# Patient Record
Sex: Male | Born: 1970 | Race: White | Hispanic: No | Marital: Single | Smoking: Current every day smoker
Health system: Southern US, Community
[De-identification: ages and names within clinical notes are randomized; demographics above are authoritative.]

## PROBLEM LIST (undated history)

## (undated) HISTORY — PX: TONSILLECTOMY: SUR1361

---

## 2012-02-25 ENCOUNTER — Emergency Department
Admission: EM | Admit: 2012-02-25 | Discharge: 2012-02-25 | Disposition: A | Discharge status: Home / Self Care | Payer: BC Managed Care – PPO | Source: Home / Self Care

## 2012-02-25 ENCOUNTER — Encounter: Payer: Self-pay | Admitting: *Deleted

## 2012-02-25 DIAGNOSIS — M545 Low back pain, unspecified: Secondary | ICD-10-CM

## 2012-02-25 DIAGNOSIS — M549 Dorsalgia, unspecified: Secondary | ICD-10-CM

## 2012-02-25 MED ORDER — METAXALONE 800 MG PO TABS: 800.0000 mg | ORAL_TABLET | Freq: Three times a day (TID) | ORAL | Status: AC

## 2012-02-25 MED ORDER — TRAMADOL-ACETAMINOPHEN 37.5-325 MG PO TABS: 1.0000 | ORAL_TABLET | Freq: Four times a day (QID) | ORAL | Status: AC | PRN

## 2012-02-25 NOTE — ED Provider Notes (Signed)
History     CSN: [id number]  Arrival date & time 02/25/12  1126   None     Chief Complaint  Patient presents with  . Back Pain    upper back    (Consider location/radiation/quality/duration/timing/severity/associated sxs/prior treatment) HPI The patient presents today with back pain.  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  No trauma, no car accidents, no falls. Location: upper mid-right back, no radiation Timing: x 3 week Description: tight, sharp, 5/10 Worse with: rest after work Better with: movement Trauma: no Bladder/bowel incontinence: no Weakness: no Fever/chills: no Night pain: no Unexplained weight loss: no Cancer/immunosuppression: no PMH of osteoporosis or chronic steroid use:  no   History reviewed. No pertinent past medical history.  Past Surgical History  Procedure Date  . Tonsillectomy     Family History  Problem Relation Age of Onset  . Cancer Mother     uterine  . Hypertension Father     History  Substance Use Topics  . Smoking status: Current Everyday Smoker -- 1.0 packs/day for 20 years    Types: Cigarettes  . Smokeless tobacco: Not on file  . Alcohol Use: No      Review of Systems  All other systems reviewed and are negative.    Allergies  Review of patient's allergies indicates no known allergies.  Home Medications   Current Outpatient Rx  Name Route Sig Dispense Refill  . ESOMEPRAZOLE MAGNESIUM 20 MG PO CPDR Oral Take 20 mg by mouth daily before breakfast.    . METAXALONE 800 MG PO TABS Oral Take 1 tablet (800 mg total) by mouth 3 (three) times daily. 21 tablet 0  . METAXALONE 800 MG PO TABS Oral Take 1 tablet (800 mg total) by mouth 3 (three) times daily. 21 tablet 0  . TRAMADOL-ACETAMINOPHEN 37.5-325 MG PO TABS Oral Take 1 tablet by mouth every 6 (six) hours as needed for pain. 30 tablet 0    BP 123/80  Pulse 77  Resp 14  Ht 5\' 10"  (1.778 m)  Wt 204 lb (92.534 kg)  BMI 29.27 kg/m2  SpO2  98%  Physical Exam  Nursing note and vitals reviewed. Constitutional: He is oriented to person, place, and time. He appears well-developed and well-nourished.  HENT:  Head: Normocephalic and atraumatic.  Eyes: No scleral icterus.  Neck: Neck supple.  Cardiovascular: Regular rhythm and normal heart sounds.   Pulmonary/Chest: Effort normal and breath sounds normal. No respiratory distress.  Musculoskeletal:       Cervical back: He exhibits tenderness, pain and spasm. He exhibits normal range of motion, no swelling and no edema.       Back:       Just to the right of midline around C7 or T1 is a spot of tenderness, mild spasm which goes to the inner scapular border.  4 range of motion of the neck with a negative Spurling test.  Distal neurovascular status is intact.   Neurological: He is alert and oriented to person, place, and time.  Skin: Skin is warm and dry.  Psychiatric: He has a normal mood and affect. His speech is normal.    ED Course  Procedures (including critical care time)  Labs Reviewed - No data to display No results found.   1. Pain, upper back       MDM   This seems to be muscle skeletal back pain which has been further exacerbated by his [employer] which includes a lot of heavy lifting pushing  and pulling.  We did not do an x-ray today, however getting 1 in another week if still painful may be appropriate to rule out things such as a clay shoveler fracture or disc pathology.  I gave him a prescription for Skelaxin and Ultracet that he can use for the next week.  He is on Nexium and I told him to be sure that he stays compliant with that while on the Ultracet.  He is also to use a heating pad every day.  He should be cautious work or when he does any kind of lifting pushing pulling or throwing.  Marlaine Hind, MD 02/25/12 [phone]

## 2012-02-25 NOTE — ED Notes (Signed)
Patient c/o upper back pain x 3 weeks. 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 He has used Advil without much relief. The pain is constant.

## 2012-03-14 ENCOUNTER — Emergency Department
Admit: 2012-03-14 | Discharge: 2012-03-14 | Disposition: A | Discharge status: Home / Self Care | Payer: BC Managed Care – PPO | Attending: Family Medicine | Admitting: Family Medicine

## 2012-03-14 ENCOUNTER — Emergency Department
Admission: EM | Admit: 2012-03-14 | Discharge: 2012-03-14 | Disposition: A | Discharge status: Home / Self Care | Payer: BC Managed Care – PPO | Source: Home / Self Care | Attending: Family Medicine | Admitting: Family Medicine

## 2012-03-14 ENCOUNTER — Encounter: Payer: Self-pay | Admitting: *Deleted

## 2012-03-14 DIAGNOSIS — M542 Cervicalgia: Secondary | ICD-10-CM

## 2012-03-14 DIAGNOSIS — M5030 Other cervical disc degeneration, unspecified cervical region: Secondary | ICD-10-CM

## 2012-03-14 DIAGNOSIS — M47812 Spondylosis without myelopathy or radiculopathy, cervical region: Secondary | ICD-10-CM

## 2012-03-14 DIAGNOSIS — M546 Pain in thoracic spine: Secondary | ICD-10-CM

## 2012-03-14 MED ORDER — MELOXICAM 15 MG PO TABS: 15.0000 mg | ORAL_TABLET | Freq: Every day | ORAL | Status: AC

## 2012-03-14 NOTE — ED Notes (Addendum)
Pt c/o upper back pain x 2 mths, no injury. He was seen 02/25/2012 was given muscle relaxant which caused GI upset.

## 2012-03-14 NOTE — Discharge Instructions (Signed)
Anterior Cervical Discectomy and Fusion Anterior cervical discectomy is surgery done on the upper spine to relieve pressure on one or more nerve roots, or on the spinal cord. There are 7 bones in your neck, called the cervical spine. These 7 bones (vertebrae) sit one on top of the other. Cushions (intervertebral discs) separate the vertebrae and act like shock absorbers. As we age, degeneration of our bones, joints, and disks can cause neck pain and tightening around the spinal cord and nerve roots. This causes arm pain and weakness.  Degeneration involves:  Herniated Disk. With age, the disks dry up and can rupture. In this condition, the center of the disc bulges out (disk herniation). This can cause pressure on a nerve, which produces pain or weakness in the arm.   Bone spurs and spinal stenosis. As we age, growths often develop on our bones. These growths are called bone spurs (osteophytes). A bone spur is a collection of calcium. As bone spurs grow and extend, the vertebral openings become narrow. The spinal canal and/or the foramen (opening for nerve passageways) become smaller. This narrowing (stenosis) may cause pinching (compression) of the spinal cord or the spinal nerve root. The nerve injury can cause pain, weakness, numbness, and loss of coordination in the upper limbs. Often, patients have difficulty with their hand writing or they start dropping things, because their hand grip is weaker. The spinal cord damage can cause increased stiffness, more frequent falls, electric shooting pain, and changes in bowel and bladder control.  Degeneration in the neck results in three common problems:  Radiculopathy - Nerve compression that results in weakness or pain that radiates down the arm.   Myelopathy - Spinal cord compression that causes stiffness, difficulty with walking, coordination, and trouble with bowel or bladder habits.   Neck pain - Worn out joints cause pain as the neck moves.    Treatment:  Radiculopathy - Surgery is performed to remove the bony and disk material that is pushing on the nerve.   Myelopathy - Surgery is performed to remove the bony and disk material that pushes on the spinal cord.   Neck pain - Surgery is performed to combine (fuse) the joints of the neck together, so they cannot move or cause pain.  Surgery can be done from the front or the back of the neck. When it is done from the front, it is called an anterior (front) cervical (neck) discectomy (removal of the disk) and fusion. LET YOUR CAREGIVER KNOW ABOUT:   Recent infections.   Any shooting pains down your leg, when you move your neck.   Any difficulty swallowing.   If you smoke.   If you are taking any blood thinners or anti-inflammatory drugs.   Any history of injury to your shoulders.   Any history of injury to your vocal cords.   Any foreign objects in your body from a previous surgery.   Any recent fevers or illness.   Past medical history (diabetes, strokes).   Past problems with anesthetics.   Possibility of pregnancy.   History of blood clots (deep vein thrombosis).   History of bleeding or blood problems.   Past surgery.   Other health problems.   Allergies.   Medicines you take, including herbs, eye drops, over-the-counter medicines, and creams.   Use of steroids (by mouth or creams).  RISKS AND COMPLICATIONS  Infection.   Bleeding.   Injury to the following structures:   Carotid artery. This can result in a stroke   or significant amount of bleeding.   Esophageus, resulting in difficulty swallowing.   Recurrent laryngeal nerve, resulting in hoarseness of the voice.   Spinal cord injury, ranging from mild to complete quadriparesis (muscle weakness in all four limbs).   Nerve root injury, resulting in muscle weakness in the upper limb.   Leakage of cerebrospinal fluid.  BEFORE THE PROCEDURE   You will be given medicine to help you sleep  (general anesthetic), and a breathing tube will be placed.   You will be given medicine to kill germs (antibiotics), to keep the infection rate down.   The incision site on your neck will be marked.   Your neck will be cleaned, to reduce the risk of infection.  PROCEDURE  An anterior cervical fusion means that the operation is done through the front (anterior) part of your neck. The cut made by the surgeon (incision) is usually within a skin fold line on the neck. After pushing aside the neck muscles, the surgeon removes the affected, degenerated disk and bone spurs (osteophytes), which takes the pressure off the nerves and spinal cord. This is called a decompression. The area where the disc was removed is then filled with a small piece of plastic. This plastic takes the place of the disk and keeps the nerve passageway (foramen) open and clear for the nerves. In most cases, the surgeon uses metal plates or pins (hardware) in the neck, to help stabilize the level being fused. The hardware reduces motion at that level, so it can fuse. This provides extra support to the neck. A cervical fusion procedure takes anywhere from a couple to several hours, depending on the size of the neck, history of previous surgery, and number of levels being fused. AFTER THE PROCEDURE   You will likely spend 24-48 hours in the hospital. During this time, your caregivers will look for any signs of complications from the procedure.   Your caregiver will watch you, to make sure that fluid draining from the surgery slows down. It is important that a large mass of blood does not form in your neck, which would cause difficulty with breathing.   You will get 24 hours of germ killing medicine (antibiotics).   You can start to eat as soon as you feel comfortable.   Once you have started eating, walking, urinating (voiding) and having bowel movements on your own, your caregiver will discharge you home.  HOME CARE INSTRUCTIONS     For 2 weeks, do not soak the incision site under water. Do not swim or take baths. Showers are ok, but rinse off the incision sites.   Do not over exert yourself. Allow time for the incision to heal.   It can take from 6 weeks to 6 months for fusion to take effect. Your caregiver may ask you to wear a neck collar during this time, as they check the fusion with multiple (serial) x-rays.  Document Released: 09/01/2009 Document Revised: 09/02/2011 Document Reviewed: 09/01/2009 [employer] Patient Information 2012 [employer]. 

## 2012-03-14 NOTE — ED Provider Notes (Signed)
History     CSN: [id number]  Arrival date & time 03/14/12  1508   First MD Initiated Contact with Patient 03/14/12 1524      Chief Complaint  Patient presents with  . Back Pain     HPI Comments: Patient returns for follow-up of upper back and neck pain that has been present for about two months (see previous office note).  He reports that there has been no improvement with medication since his previous visit (Skelaxin caused nausea).  He has pain with cough and sneezing.  He has pain when moving in bed at night.  He reports the occurrence of brief tingling sensations in his hands several times daily that resolve with repositioning.  He denies cough.  He recalls no upper back or neck injury.  He is a smoker.  Patient is a 41 y.o. male presenting with back pain. The history is provided by the patient.  Back Pain  This is a chronic problem. Episode onset: about two months ago. The problem occurs constantly. The problem has not changed since onset.The pain is associated with no known injury. The pain is present in the thoracic spine. The quality of the pain is described as aching. The pain does not radiate. The pain is at a severity of 5/10. The pain is moderate. The symptoms are aggravated by certain positions. The pain is worse during the night. Associated symptoms include numbness, paresthesias and tingling. Pertinent negatives include no chest pain, no fever, no weight loss, no headaches, no abdominal pain, no abdominal swelling and no weakness. He has tried muscle relaxants for the symptoms. The treatment provided no relief.    History reviewed. No pertinent past medical history.  Past Surgical History  Procedure Date  . Tonsillectomy     Family History  Problem Relation Age of Onset  . Cancer Mother     uterine  . Hypertension Father     History  Substance Use Topics  . Smoking status: Current Everyday Smoker -- 1.0 packs/day for 20 years    Types: Cigarettes  . Smokeless  tobacco: Not on file  . Alcohol Use: No      Review of Systems  Constitutional: Negative for fever and weight loss.  Cardiovascular: Negative for chest pain.  Gastrointestinal: Negative for abdominal pain.  Musculoskeletal: Positive for back pain.  Neurological: Positive for tingling, numbness and paresthesias. Negative for weakness and headaches.  All other systems reviewed and are negative.    Allergies  Review of patient's allergies indicates no known allergies.  Home Medications   Current Outpatient Rx  Name Route Sig Dispense Refill  . ESOMEPRAZOLE MAGNESIUM 20 MG PO CPDR Oral Take 20 mg by mouth daily before breakfast.    . MELOXICAM 15 MG PO TABS Oral Take 1 tablet (15 mg total) by mouth daily. Take with food each evening 30 tablet 1    BP 136/84  Pulse 78  Temp 98 F (36.7 C) (Oral)  Resp 18  Ht 5\' 9"  (1.753 m)  Wt 201 lb 8 oz (91.4 kg)  BMI 29.76 kg/m2  SpO2 98%  Physical Exam Nursing notes and Vital Signs reviewed. Appearance:  Patient appears healthy, stated age, and in no acute distress Eyes:  Pupils are equal, round, and reactive to light and accomodation.  Extraocular movement is intact.  Conjunctivae are not inflamed  Pharynx:  Normal Neck:  Supple.  No adenopathy.  Neck has full range of motion.  There is distinct mid-line tenderness over the  C-spine from occipital area to upper thoracic spine. Lungs:  Clear to auscultation.  Breath sounds are equal.  Heart:  Regular rate and rhythm without murmurs, rubs, or gallops.  Abdomen:  Nontender without masses or hepatosplenomegaly.  Bowel sounds are present.  No CVA or flank tenderness.  Extremities:  No edema.  No calf tenderness Skin:  No rash present.   Back:  Full range of motion.  There is distinct tenderness over the thoracic spine in the midline from base of C-spine to mid-scapulae.  No rhomboid muscle tenderness.  No lower back tenderness.  No swelling, erythema, or warmth. ED Course  Procedures   none   Dg Chest 2 View  03/14/2012  *RADIOLOGY REPORT*  Clinical Data: Back pain  CHEST - 2 VIEW  Comparison: None.  Findings: The heart is normal in size.  Bronchitic changes. Peripheral lungs are clear.  No pneumothorax or pleural effusion.  IMPRESSION: No active cardiopulmonary disease.  Original Report Authenticated By: Donavan Burnet, M.D.   Dg Cervical Spine 2-3 Views  03/14/2012  *RADIOLOGY REPORT*  Clinical Data: Neck pain  CERVICAL SPINE - 2-3 VIEW  Comparison: None.  Findings: The cervical vertebrae are straightened in alignment. There is degenerative disc disease primarily at C5-6 and to a lesser degree at C4-5 with some loss of disc space and spurring. No prevertebral soft tissue swelling is seen.  The odontoid process is not optimally seen but grossly is intact, and the lung apices are clear.  IMPRESSION: Straightened alignment with degenerative disc disease at C5-6 and to a lesser degree at C4-5.  Original Report Authenticated By: Juline Patch, M.D.   Dg Thoracic Spine 2 View  03/14/2012  *RADIOLOGY REPORT*  Clinical Data: Mid back pain  THORACIC SPINE - 2 VIEW  Comparison: None.  Findings: The thoracic vertebrae are in normal alignment.  No compression deformity is seen.  Intervertebral disc spaces appear normal.  No paravertebral soft tissue swelling is seen.  IMPRESSION: Negative.  Original Report Authenticated By: Juline Patch, M.D.     1. DJD (degenerative joint disease) of cervical spine       MDM  Begin Mobic at bedtime. Followup with neurosurgeon if not improved 1 to 2 months or if develops increasing pain/paresthesias in arms.        Lattie Haw, MD 03/14/12 1705

## 2012-12-11 IMAGING — CR DG CERVICAL SPINE 2 OR 3 VIEWS
3 series · 3 of 3 positions shown · non-contrast
Comparison: None.

CLINICAL DATA: Neck pain

CERVICAL SPINE - 2-3 VIEW

[view not recorded (1 of 3)]
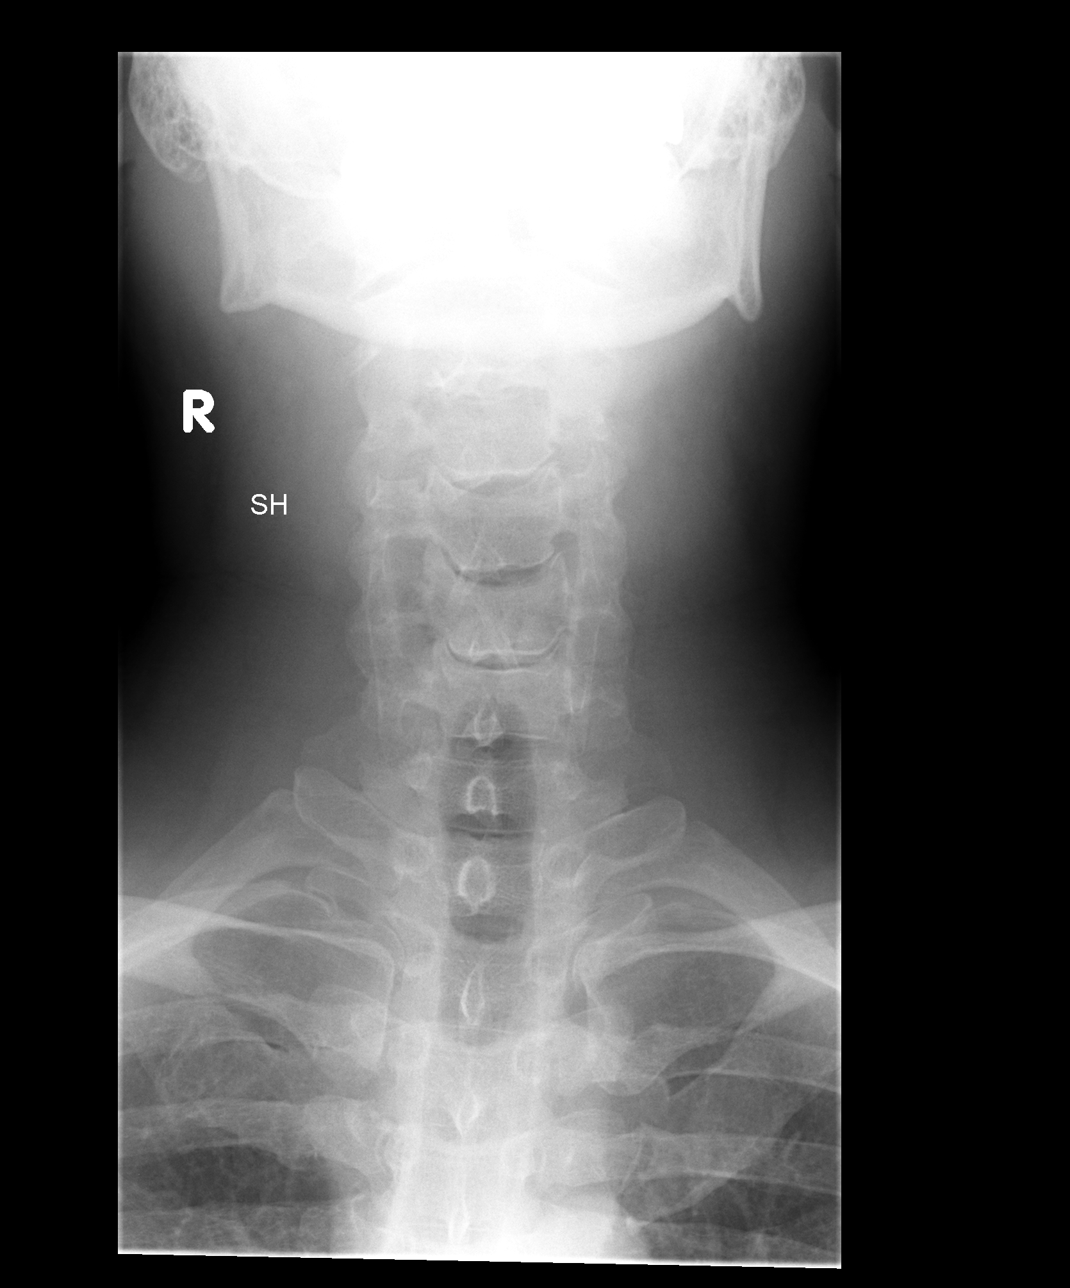

[view not recorded (2 of 3)]
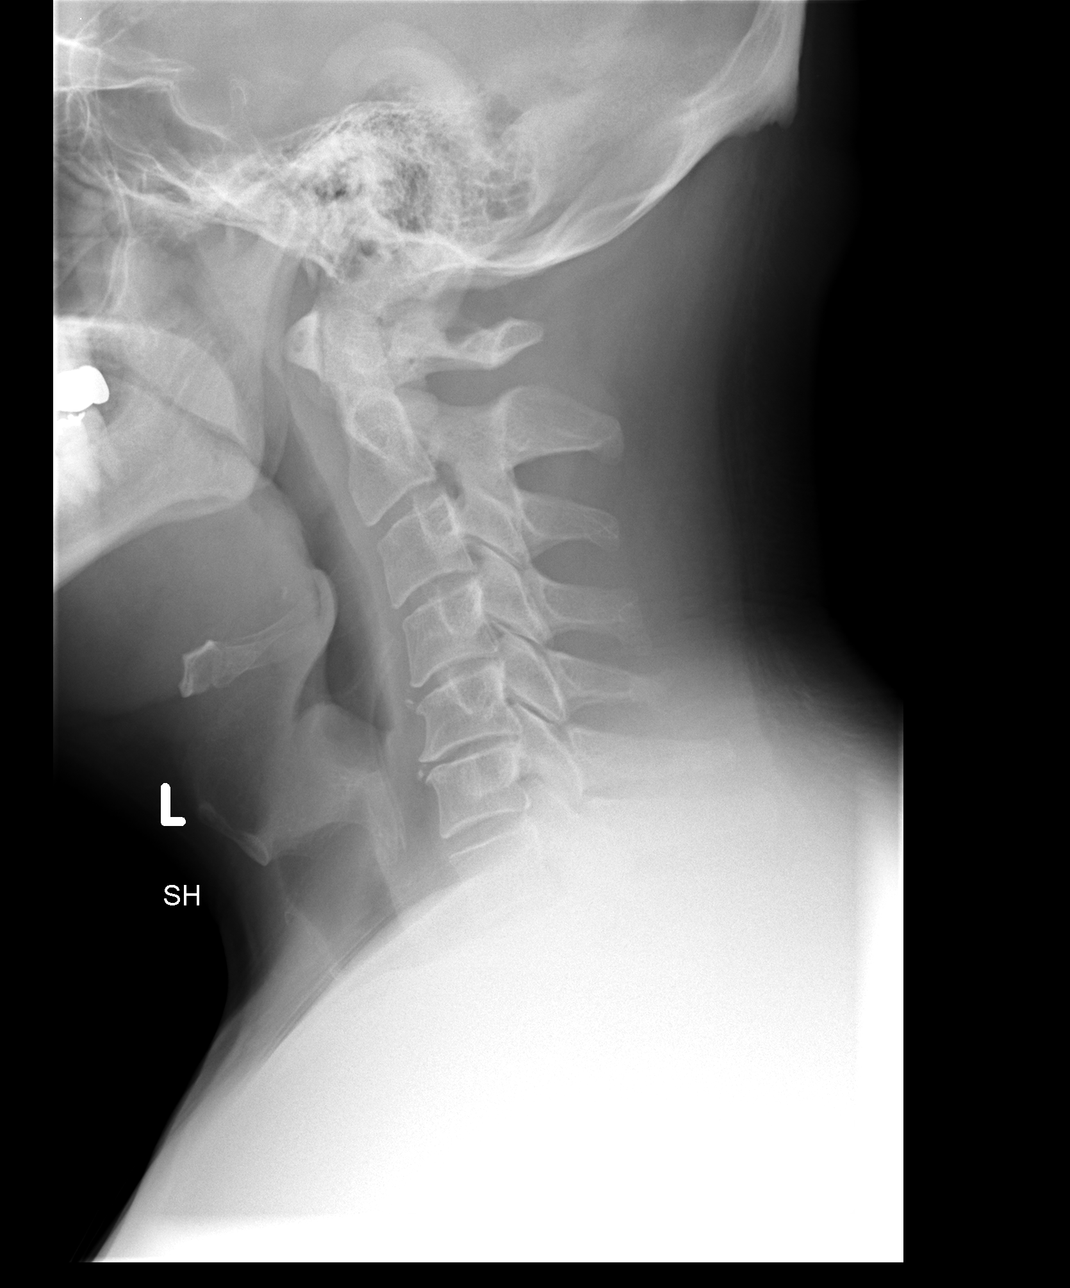

[view not recorded (3 of 3)]
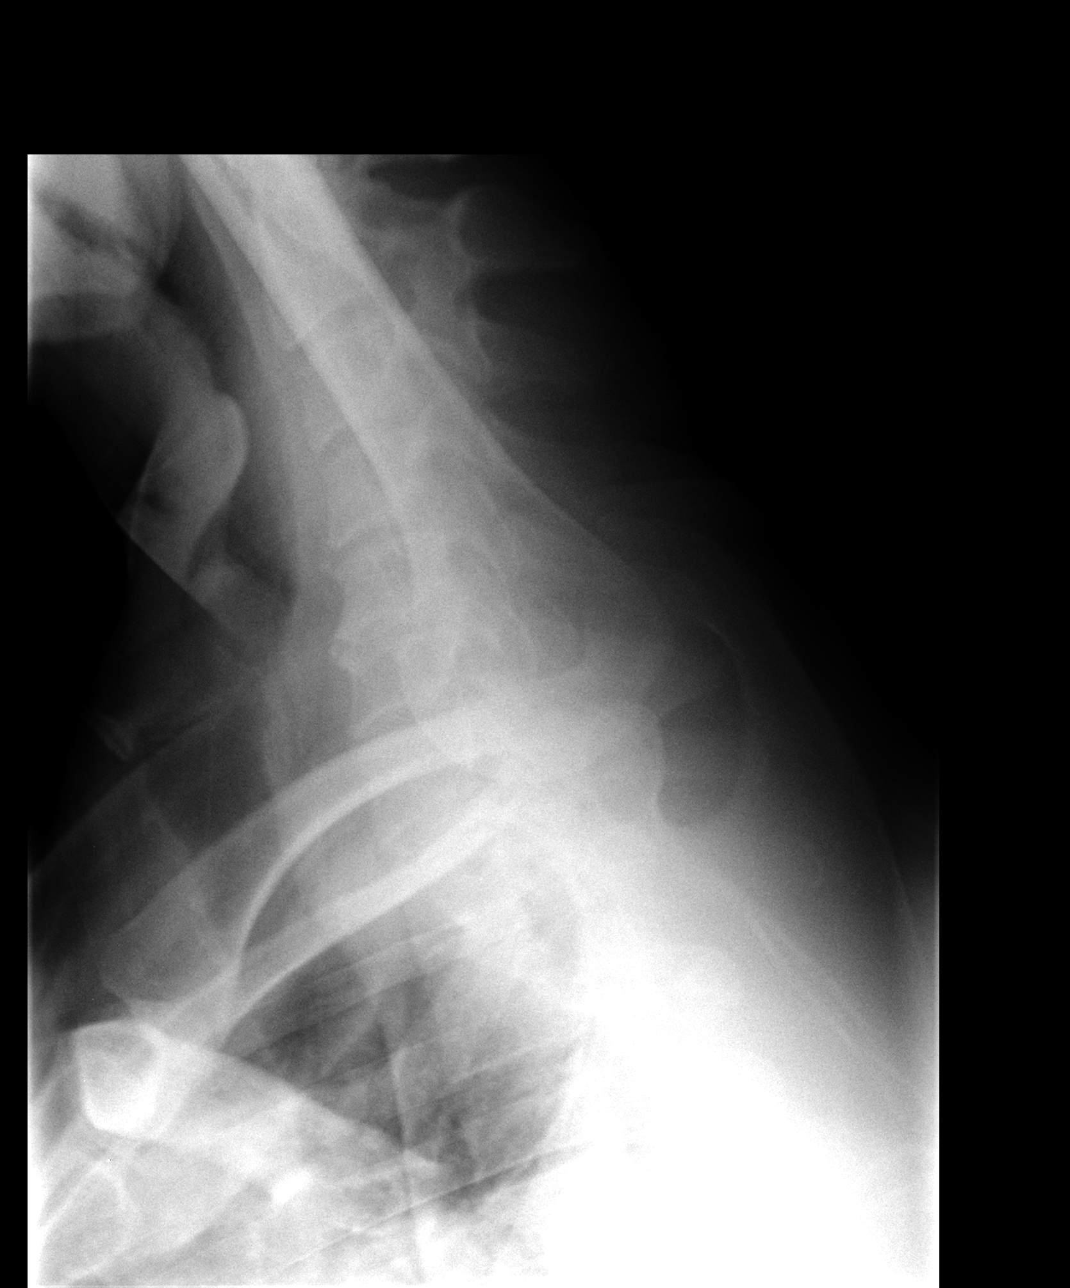

[3 of 3 positions shown; findings below may reference images not displayed]

FINDINGS: The cervical vertebrae are straightened in alignment.
There is degenerative disc disease primarily at C5-6 and to a
lesser degree at C4-5 with some loss of disc space and spurring.
No prevertebral soft tissue swelling is seen.  The odontoid process
is not optimally seen but grossly is intact, and the lung apices
are clear.
IMPRESSION: Straightened alignment with degenerative disc disease at C5-6 and
to a lesser degree at C4-5.

## 2015-04-22 ENCOUNTER — Emergency Department (INDEPENDENT_AMBULATORY_CARE_PROVIDER_SITE_OTHER): Payer: BLUE CROSS/BLUE SHIELD

## 2015-04-22 ENCOUNTER — Emergency Department
Admission: EM | Admit: 2015-04-22 | Discharge: 2015-04-22 | Disposition: A | Discharge status: Home / Self Care | Payer: BLUE CROSS/BLUE SHIELD | Source: Home / Self Care | Attending: Family Medicine | Admitting: Family Medicine

## 2015-04-22 ENCOUNTER — Encounter: Payer: Self-pay | Admitting: *Deleted

## 2015-04-22 DIAGNOSIS — M5030 Other cervical disc degeneration, unspecified cervical region: Secondary | ICD-10-CM | POA: Diagnosis not present

## 2015-04-22 DIAGNOSIS — M542 Cervicalgia: Secondary | ICD-10-CM

## 2015-04-22 MED ORDER — METHOCARBAMOL 500 MG PO TABS: 500.0000 mg | ORAL_TABLET | Freq: Two times a day (BID) | ORAL | Status: DC

## 2015-04-22 MED ORDER — MELOXICAM 7.5 MG PO TABS: 7.5000 mg | ORAL_TABLET | Freq: Every day | ORAL | Status: DC

## 2015-04-22 NOTE — ED Provider Notes (Signed)
CSN: [id number]     Arrival date & time 04/22/15  1328 History   First MD Initiated Contact with Patient 04/22/15 1340     Chief Complaint  Patient presents with  . Neck Pain   (Consider location/radiation/quality/duration/timing/severity/associated sxs/prior Treatment) HPI The patient is a 44 year old male presenting to urgent care with complaints of gradually worsening left-sided neck pain for the last 2-3 months.  Patient states he has had similar pain for the last 2-3 years, but has significantly worsened and states it interferes with his day-to-day activities.  Pain is achy and sore, occasionally will "catch" and be a sharp stabbing pain in the left side of his neck.  Pain is worse with head rotation to the left.  He has tried ibuprofen with minimal relief.  Denies any recent falls or heavy lifting.  States he was seen for same 2-3 years ago at this same urgent care.  Per medical records from 2013.  Patient was diagnosed with degenerative disc disease in the cervical spine.  Patient was advised at that time and follow-up with a neurosurgeon.  However, patient states he has not followed up with any specialist for the symptoms.  Denies numbness, tingling or weakness in arms or legs.  Denies fevers, chills, nausea, vomiting.  Denies rashes or bruising.  History reviewed. No pertinent past medical history. Past Surgical History  Procedure Laterality Date  . Tonsillectomy     Family History  Problem Relation Age of Onset  . Cancer Mother     uterine  . Hypertension Father    History  Substance Use Topics  . Smoking status: Current Every Day Smoker -- 1.00 packs/day for 20 years    Types: Cigarettes  . Smokeless tobacco: Never Used  . Alcohol Use: No    Review of Systems  Constitutional: Negative for fever and chills.  Musculoskeletal: Positive for myalgias, arthralgias, neck pain and neck stiffness. Negative for joint swelling and gait problem.       Left side neck pain  Skin:  Negative for color change, rash and wound.  Neurological: Negative for weakness, numbness and headaches.    Allergies  Review of patient's allergies indicates no known allergies.  Home Medications   Prior to Admission medications   Medication Sig Start Date End Date Taking? Authorizing Provider  meloxicam (MOBIC) 7.5 MG tablet Take 1 tablet (7.5 mg total) by mouth daily. 04/22/15   Junius Finner, PA-C  methocarbamol (ROBAXIN) 500 MG tablet Take 1 tablet (500 mg total) by mouth 2 (two) times daily. 04/22/15   Junius Finner, PA-C   BP 129/82 mmHg  Pulse 88  Resp 16  Wt 194 lb (87.998 kg)  SpO2 96% Physical Exam  Constitutional: He is oriented to person, place, and time. He appears well-developed and well-nourished.  HENT:  Head: Normocephalic and atraumatic.  Eyes: EOM are normal.  Neck: Normal range of motion. Neck supple.  Tenderness to cervical spine and Left cervical muscles. Increased pain with head rotation to the Left.  Cardiovascular: Normal rate.   Pulmonary/Chest: Effort normal.  Musculoskeletal: Normal range of motion. He exhibits tenderness. He exhibits no edema.  Cervical spinal tenderness (see neck exam) FROM upper and lower extremities with 5/5 strength bilaterally   Neurological: He is alert and oriented to person, place, and time.  Sensation normal in upper and lower extremities bilaterally.   Skin: Skin is warm and dry. No erythema.  Psychiatric: He has a normal mood and affect. His behavior is normal.  Nursing note  and vitals reviewed.   ED Course  Procedures (including critical care time) Labs Review Labs Reviewed - No data to display  Imaging Review Dg Cervical Spine Complete  04/22/2015   CLINICAL DATA:  Left-side and posterior neck pain for a few years. No recent injury.  EXAM: CERVICAL SPINE  4+ VIEWS  COMPARISON:  Plain film cervical spine 03/14/2012.  FINDINGS: There is mild reversal of the normal cervical lordosis, unchanged. Loss of disc space  height and endplate spurring are most notable at C5-6 and also unchanged in appearance. Milder degree of loss of disc space height at C4-5 is again seen as on the prior study. The facet joints are unremarkable. Prevertebral soft tissues appear normal. The lung apices are clear.  IMPRESSION: No acute abnormality.  No change in the appearance of C4-5 and C5-6 degenerative change.   Electronically Signed   By: Drusilla Kanner M.D.   On: 04/22/2015 14:30     MDM   1. DDD (degenerative disc disease), cervical   2. Neck pain on left side    Patient is a 44 year old male complaining of left-sided neck pain is worse in the last 2-3 months.  However, states he originally had neck pain 3 years ago.  History of degenerative disc disease in cervical spine.  No known injuries.  Plain films performed today to see if progression of DDD. Plain film cervical spine.  No acute abnormality.  No change in appearance of C4-5 and C5-6 degenerative change. Will treat conservatively for pain.  Discussed home exercises as well as alternating ice and heat. Rx: Robaxin and meloxicam F/u with PCP in 1-2 weeks if not improving; may need referral to orthopedist or neurosurgery for further evaluation and treatment.  Patient verbalized understanding and agreement with treatment plan.     Junius Finner, PA-C 04/22/15 1441

## 2015-04-22 NOTE — ED Notes (Signed)
Pt c/o left sided intermittent neck pain x 2-3 years, recently worse. No known injury.

## 2015-04-22 NOTE — Discharge Instructions (Signed)
Robaxin is a muscle relaxer and may cause drowsiness. Do not drink alcohol, drive, or operate heavy machinery while taking. ° °Meloxicam (Mobic) is an antiinflammatory to help with pain and inflammation.  Do not take ibuprofen, Advil, Aleve, or any other medications that contain NSAIDs while taking meloxicam as this may cause stomach upset or even ulcers if taken in large amounts for an extended period of time.  ° ° °

## 2016-11-05 ENCOUNTER — Encounter: Payer: Self-pay | Admitting: *Deleted

## 2016-11-05 ENCOUNTER — Emergency Department
Admission: EM | Admit: 2016-11-05 | Discharge: 2016-11-05 | Disposition: A | Discharge status: Home / Self Care | Payer: BLUE CROSS/BLUE SHIELD | Source: Home / Self Care | Attending: Family Medicine | Admitting: Family Medicine

## 2016-11-05 DIAGNOSIS — S61012A Laceration without foreign body of left thumb without damage to nail, initial encounter: Secondary | ICD-10-CM

## 2016-11-05 MED ORDER — HYDROCODONE-ACETAMINOPHEN 5-325 MG PO TABS: 1.0000 | ORAL_TABLET | Freq: Four times a day (QID) | ORAL | 0 refills | Status: AC | PRN

## 2016-11-05 MED ORDER — CEPHALEXIN 500 MG PO CAPS: 500.0000 mg | ORAL_CAPSULE | Freq: Two times a day (BID) | ORAL | 0 refills | Status: AC

## 2016-11-05 NOTE — Discharge Instructions (Signed)
°  Norco/Vicodin (hydrocodone-acetaminophen) is a narcotic pain medication, do not combine these medications with others containing tylenol. While taking, do not drink alcohol, drive, or perform any other activities that requires focus while taking these medications.  Also be cautious while taking this medication with muscle relaxers such as methocarbamol (Robaxin) as it can cause severe fatigue, drowsiness, and even difficulty breathing if you take too much sedating medication at the same time.

## 2016-11-05 NOTE — ED Triage Notes (Signed)
Patient reports he was cutting on a wooden door with a razor blade about 15 minutes ago. He received a laceration to his left thumb. Denies anticoagulants. Cleaned site with Hibiclens. Slowed bleeding. Reports TDaP was 3 years ago.

## 2016-11-05 NOTE — ED Provider Notes (Signed)
CSN: [id number]     Arrival date & time 11/05/16  1342 History   First MD Initiated Contact with Patient 11/05/16 1356     Chief Complaint  Patient presents with  . Laceration    left thumb   (Consider location/radiation/quality/duration/timing/severity/associated sxs/prior Treatment) HPI Roy Frazier is a 46 y.o. male presenting to UC with c/o laceration to his Left thumb that occurred just PTA.  Pt notes he was cutting on a wood door with a razor blade that slipped and cut his thumb. He applied a washcloth and direct pressure but thumb is still slowly bleeding. He is not on blood thinners. Pain is moderate in severity, stinging and throbbing.  He is Right hand dominant.  Tetanus UTD.  History reviewed. No pertinent past medical history. Past Surgical History:  Procedure Laterality Date  . TONSILLECTOMY     Family History  Problem Relation Age of Onset  . Cancer Mother     uterine  . Hypertension Father    Social History  Substance Use Topics  . Smoking status: Current Every Day Smoker    Packs/day: 1.00    Years: 20.00    Types: Cigarettes  . Smokeless tobacco: Never Used  . Alcohol use No    Review of Systems  Musculoskeletal: Negative for arthralgias and myalgias.  Skin: Positive for wound. Negative for color change.  Neurological: Negative for weakness and numbness.    Allergies  Patient has no known allergies.  Home Medications   Prior to Admission medications   Medication Sig Start Date End Date Taking? Authorizing Provider  cephALEXin (KEFLEX) 500 MG capsule Take 1 capsule (500 mg total) by mouth 2 (two) times daily. 11/05/16   Junius Finner, PA-C  HYDROcodone-acetaminophen (NORCO/VICODIN) 5-325 MG tablet Take 1-2 tablets by mouth every 6 (six) hours as needed for moderate pain or severe pain. 11/05/16   Junius Finner, PA-C   Meds Ordered and Administered this Visit  Medications - No data to display  BP 128/88 (BP Location: Left Arm)   Pulse 103   Temp 97.9 F  (36.6 C) (Oral)   SpO2 97%  No data found.   Physical Exam  Constitutional: He is oriented to person, place, and time. He appears well-developed and well-nourished.  HENT:  Head: Normocephalic and atraumatic.  Eyes: EOM are normal.  Neck: Normal range of motion.  Cardiovascular: Normal rate.   Pulmonary/Chest: Effort normal.  Musculoskeletal: Normal range of motion. He exhibits tenderness. He exhibits no edema.       Left hand: He exhibits tenderness and laceration.       Hands: Left thumb: full RON (see skin exam)  Neurological: He is alert and oriented to person, place, and time.  Skin: Skin is warm and dry. Capillary refill takes less than 2 seconds. Laceration noted.  Left thumb, radial aspect: 2cm deep laceration. No tendon involvement. No foreign bodies seen or palpated. Laceration goes along nailbed but does not involve nail.  Active bleeding, controlled with direct pressure.   Psychiatric: He has a normal mood and affect. His behavior is normal.  Nursing note and vitals reviewed.   Urgent Care Course     .Marland KitchenLaceration Repair Date/Time: 11/05/2016 3:17 PM Performed by: Junius Finner Authorized by: Donna Christen A   Consent:    Consent obtained:  Verbal   Consent given by:  Patient   Risks discussed:  Pain, poor cosmetic result, infection, poor wound healing and nerve damage   Alternatives discussed:  Delayed treatment Anesthesia (see  MAR for exact dosages):    Anesthesia method:  Nerve block and local infiltration   Local anesthetic:  Bupivacaine 0.5% w/o epi   Block location:  Digital, Left thumb   Block needle gauge:  25 G   Block anesthetic:  Bupivacaine 0.5% w/o epi   Block outcome:  Incomplete block (senstation at most distal aspect of wound. ) Laceration details:    Location:  Finger   Finger location:  L thumb   Length (cm):  2   Depth (mm):  2 Pre-procedure details:    Preparation:  Patient was prepped and draped in usual sterile fashion and imaging  obtained to evaluate for foreign bodies Exploration:    Hemostasis achieved with:  Direct pressure   Wound exploration: wound explored through full range of motion and entire depth of wound probed and visualized     Wound extent: no muscle damage noted, no tendon damage noted and no vascular damage noted     Contaminated: no   Treatment:    Area cleansed with:  Saline   Amount of cleaning:  Standard   Irrigation solution:  Sterile saline   Irrigation volume:  20    Irrigation method:  Syringe Mucous membrane repair:    Wound mucous membrane closure material used: prolene. Skin repair:    Repair method:  Sutures   Suture size:  4-0   Suture material:  Prolene   Suture technique:  Simple interrupted   Number of sutures:  4 Approximation:    Approximation:  Close   Vermilion border: well-aligned   Post-procedure details:    Dressing:  Antibiotic ointment and bulky dressing   Patient tolerance of procedure:  Tolerated well, no immediate complications   (including critical care time)  Labs Review Labs Reviewed - No data to display  Imaging Review No results found.   MDM   1. Thumb laceration, left, initial encounter    Laceration to Left thumb, deep laceration w/o foreign bodies noted on exam. No tendon involvement.  Sutures placed w/o immediate complications.  Due to depth of wound, will place on prophylactic antibiotics- Keflex Rx: Norco for severe pain  F/u with PCP or return to UC in 10 days for suture removal, sooner if signs of infection.     Junius Finnerrin O'Malley, PA-C 11/05/16 [phone]

## 2016-11-06 ENCOUNTER — Telehealth: Payer: Self-pay | Admitting: Emergency Medicine

## 2016-11-06 NOTE — Telephone Encounter (Signed)
Called to check on pt. Pt states he is doing well. Advised pt he may gently remove bandage and replace with a new one. Encouraged to call our office before close at [facility]6PM if he has questions/concerns.
